# Patient Record
Sex: Female | Born: 1990 | Race: Asian | Hispanic: No | Marital: Single | Smoking: Never smoker
Health system: Southern US, Community
[De-identification: ages and names within clinical notes are randomized; demographics above are authoritative.]

## PROBLEM LIST (undated history)

## (undated) DIAGNOSIS — E785 Hyperlipidemia, unspecified: Secondary | ICD-10-CM

## (undated) DIAGNOSIS — E079 Disorder of thyroid, unspecified: Secondary | ICD-10-CM

## (undated) DIAGNOSIS — E282 Polycystic ovarian syndrome: Secondary | ICD-10-CM

## (undated) DIAGNOSIS — L680 Hirsutism: Secondary | ICD-10-CM

## (undated) HISTORY — DX: Disorder of thyroid, unspecified: E07.9

## (undated) HISTORY — DX: Hyperlipidemia, unspecified: E78.5

## (undated) HISTORY — DX: Polycystic ovarian syndrome: E28.2

## (undated) HISTORY — DX: Hirsutism: L68.0

---
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
## 2011-08-19 ENCOUNTER — Other Ambulatory Visit: Payer: Self-pay | Admitting: Gastroenterology

## 2011-08-19 DIAGNOSIS — R102 Pelvic and perineal pain: Secondary | ICD-10-CM

## 2011-08-20 ENCOUNTER — Ambulatory Visit
Admission: RE | Admit: 2011-08-20 | Discharge: 2011-08-20 | Disposition: A | Discharge status: Home / Self Care | Payer: BC Managed Care – PPO | Source: Ambulatory Visit | Attending: Gastroenterology | Admitting: Gastroenterology

## 2011-08-20 DIAGNOSIS — R102 Pelvic and perineal pain: Secondary | ICD-10-CM

## 2012-07-14 IMAGING — US US PELVIS COMPLETE
1 series · 14 of 25 positions shown · non-contrast
Comparison: No comparison studies available.

CLINICAL DATA: Pelvic pain.  Evaluate for polycystic ovaries.

TRANSABDOMINAL ULTRASOUND OF PELVIS
TECHNIQUE: Transabdominal ultrasound examination of the pelvis was
performed including evaluation of the uterus, ovaries, adnexal
regions, and pelvic cul-de-sac.

[Series 1: us pelvis complete · 0.24mm/px · 14 of 34 slices shown]
[im 1/34]
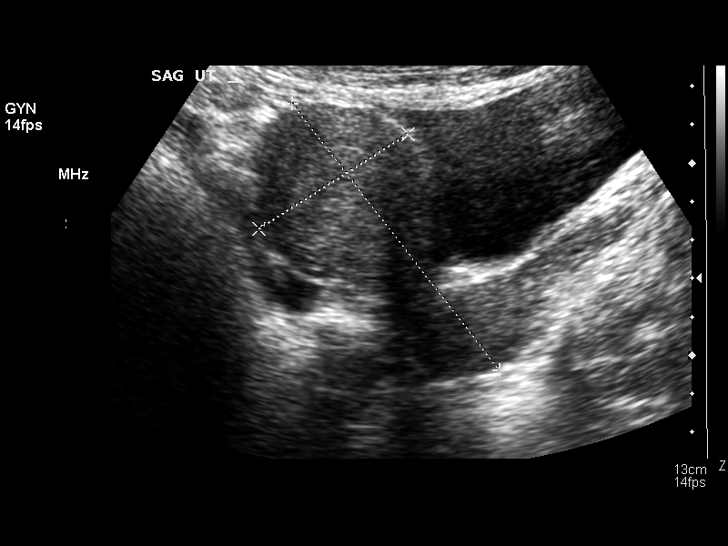
[im 3/34]
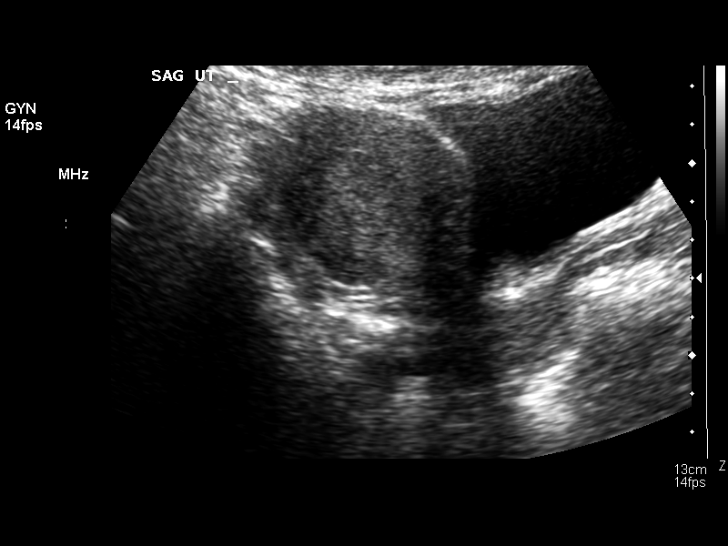
[im 6/34]
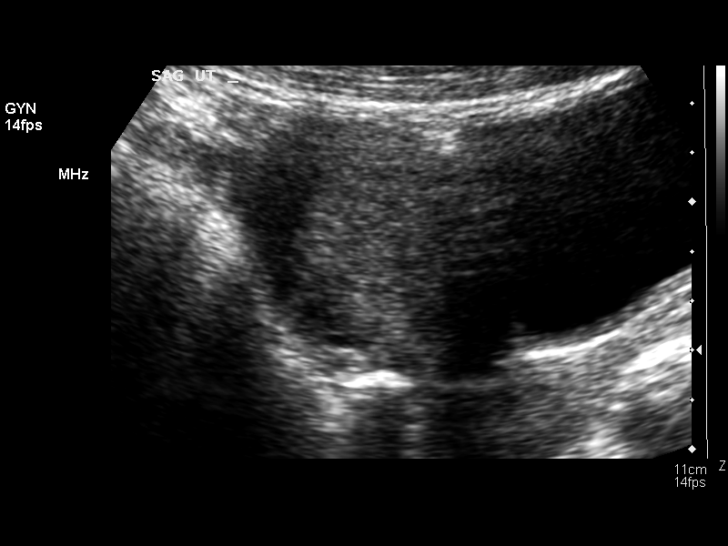
[im 9/34]
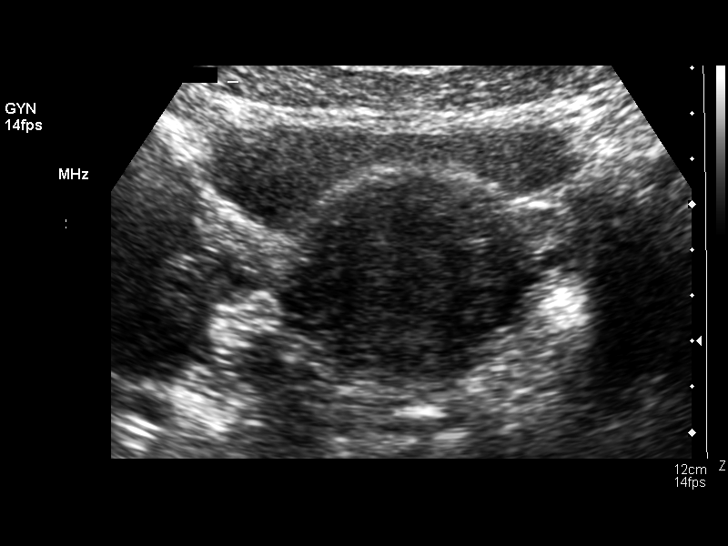
[im 12/34]
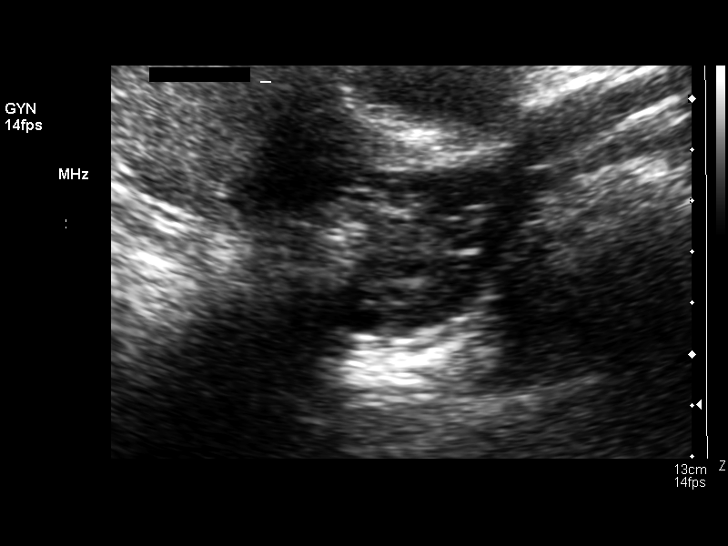
[im 13/34]
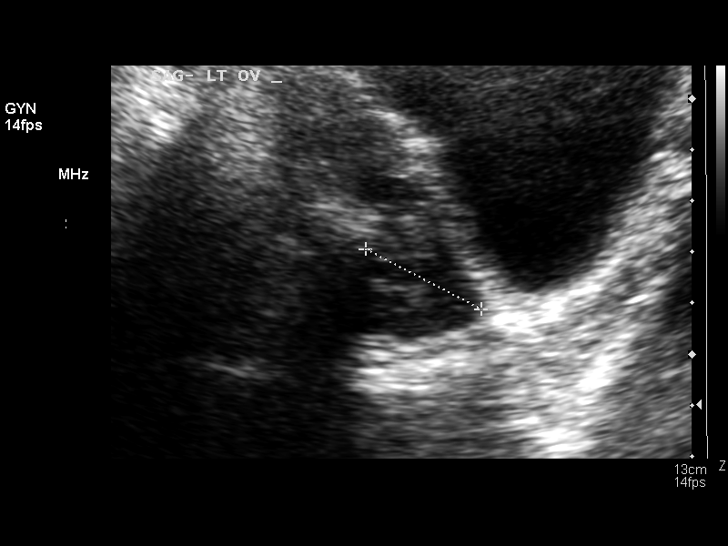
[im 16/34]
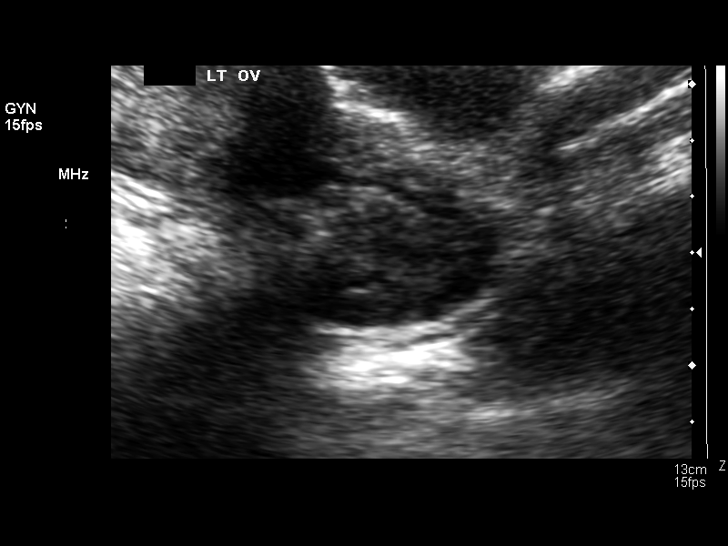
[im 18/34]
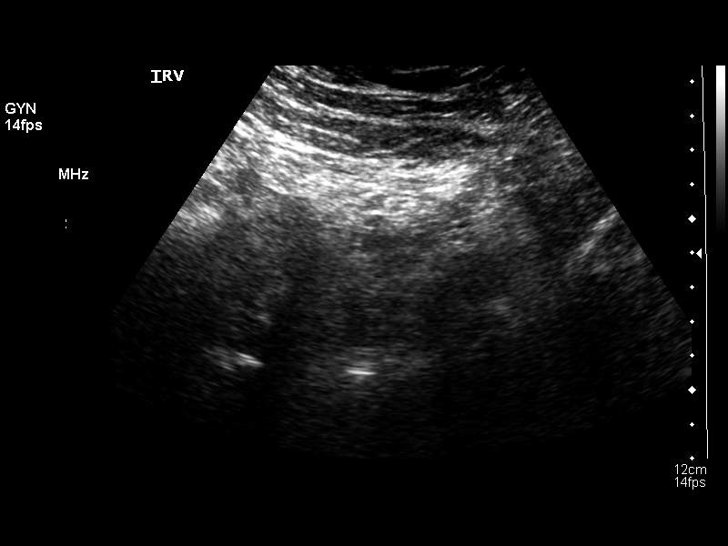
[im 21/34]
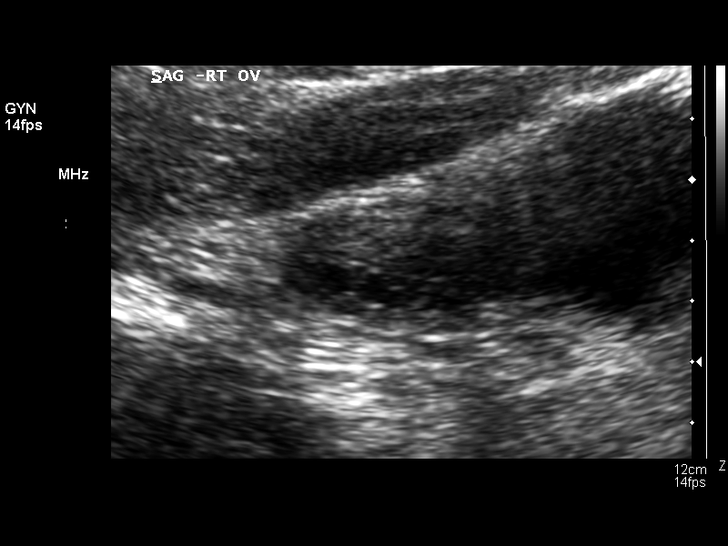
[im 23/34]
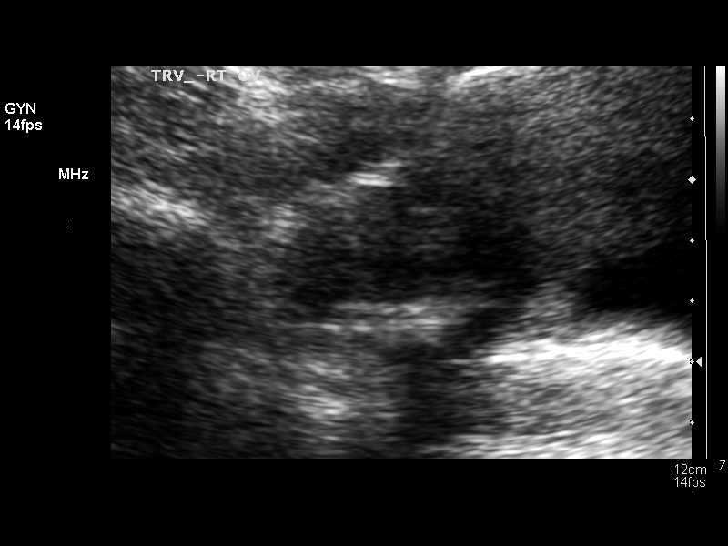
[im 25/34]
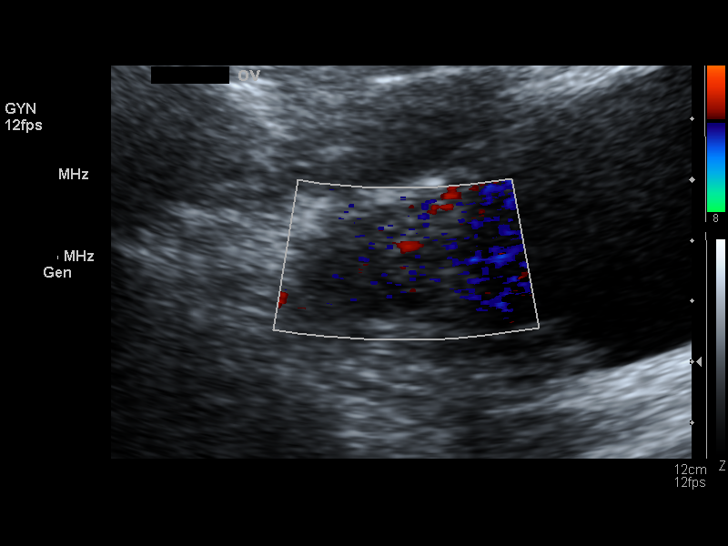
[im 28/34]
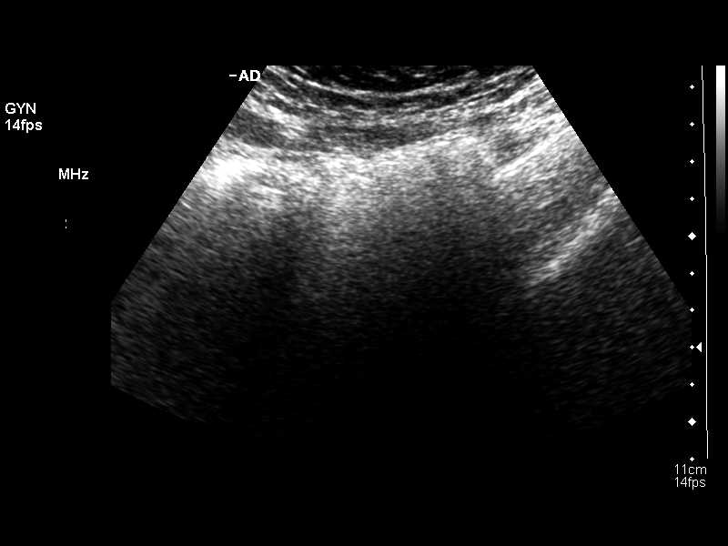
[im 31/34]
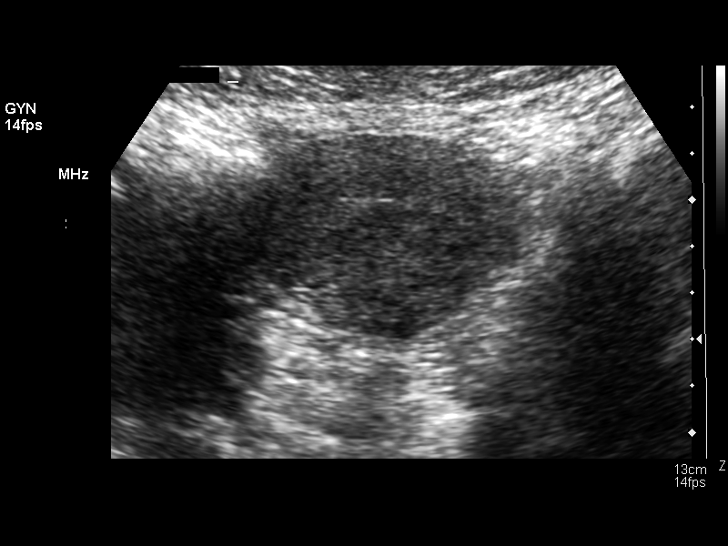
[im 34/34]
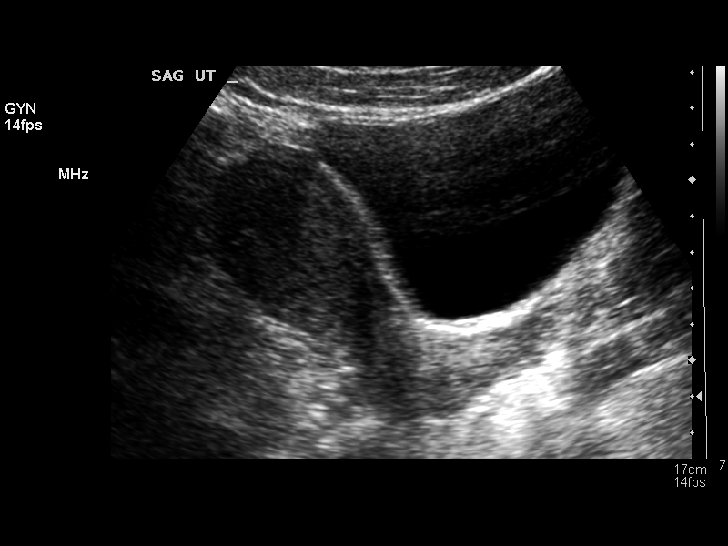

[14 of 25 positions shown; findings below may reference images not displayed]

FINDINGS: Uterus:  8.8 x 4.6 x 5.4 cm.  Myometrial echotexture is
homogeneous.  No evidence for fibroids.

Endometrium: 14-16 mm stripe thickness.  This could be within
normal limits for age. Nofluid in the endometrial cavity.

Right ovary: 3.6 x 2.2 x 3.1 cm.  Sonographically normal.

Left ovary: 3.8 x 2.5 x 2.6 cm.  Sonographically normal.

Other Findings:  No free fluid
IMPRESSION: Normal study. No evidence of pelvic mass or other significant
abnormality.

## 2013-05-09 ENCOUNTER — Encounter: Payer: Self-pay | Admitting: *Deleted

## 2013-05-10 ENCOUNTER — Ambulatory Visit (INDEPENDENT_AMBULATORY_CARE_PROVIDER_SITE_OTHER): Payer: BC Managed Care – PPO | Admitting: Obstetrics & Gynecology

## 2013-05-10 ENCOUNTER — Encounter: Payer: Self-pay | Admitting: Obstetrics & Gynecology

## 2013-05-10 VITALS — BP 118/76 | Ht 67.5 in | Wt 192.2 lb

## 2013-05-10 DIAGNOSIS — Z124 Encounter for screening for malignant neoplasm of cervix: Secondary | ICD-10-CM

## 2013-05-10 DIAGNOSIS — E039 Hypothyroidism, unspecified: Secondary | ICD-10-CM

## 2013-05-10 DIAGNOSIS — Z01419 Encounter for gynecological examination (general) (routine) without abnormal findings: Secondary | ICD-10-CM

## 2013-05-10 DIAGNOSIS — E282 Polycystic ovarian syndrome: Secondary | ICD-10-CM

## 2013-05-10 MED ORDER — LEVOTHYROXINE SODIUM 75 MCG PO TABS: 75.0000 ug | ORAL_TABLET | Freq: Every day | ORAL | Status: DC

## 2013-05-10 MED ORDER — NORETHIN ACE-ETH ESTRAD-FE 1.5-30 MG-MCG PO TABS: 1.0000 | ORAL_TABLET | Freq: Every day | ORAL | Status: DC

## 2013-05-10 MED ORDER — METFORMIN HCL ER (MOD) 500 MG PO TB24: 500.0000 mg | ORAL_TABLET | Freq: Every day | ORAL | Status: DC

## 2013-05-10 NOTE — Progress Notes (Signed)
22 y.o. G0P0000 Single [place] F here for annual exam.  Rising senoir.  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  Studying for this with a prep course.  Stopped spirinolactone for one week.  Doing well with the Loestrin 1.5/30.  Cycle is three or four days.  Regular.  Very light.  Hair growth seems less.  Broke foot while working out with a Psychologist, educational.  Was in a boot for four months.  Now exercising more normally.   Patient's last menstrual period was 04/21/2013.          Sexually active: no  The current method of family planning is OCP (estrogen/progesterone).    Exercising: yes  eliptical at the gym Smoker:  no  Health Maintenance: Pap:  none History of abnormal Pap:  no MMG:  none Colonoscopy:  none BMD:   none TDaP:  Up to date Screening Labs: Dr Loreta Ave, Hb today: Dr Loreta Ave, Urine today: Dr Loreta Ave   reports that she has never smoked. She has never used smokeless tobacco. She reports that  drinks alcohol. She reports that she does not use illicit drugs.  Past Medical History  Diagnosis Date  . PCOS (polycystic ovarian syndrome)   . Thyroid disease   . Hirsutism     History reviewed. No pertinent past surgical history.  Current Outpatient Prescriptions  Medication Sig Dispense Refill  . docusate sodium (COLACE) 100 MG capsule Take 100 mg by mouth 2 (two) times daily.      Marland Kitchen GILDESS FE 1.5/30 1.5-30 MG-MCG tablet daily.      Marland Kitchen levothyroxine (SYNTHROID, LEVOTHROID) 75 MCG tablet Take 75 mcg by mouth daily before breakfast.      . lidocaine-prilocaine (EMLA) cream       . metFORMIN (GLUMETZA) 500 MG (MOD) 24 hr tablet Take 500 mg by mouth daily with breakfast.      . Probiotic Product (PROBIOTIC DAILY PO) Take by mouth daily.      Marland Kitchen spironolactone (ALDACTONE) 50 MG tablet Take 50 mg by mouth 2 (two) times daily.       No current facility-administered medications for this visit.    Family History  Problem Relation Age of Onset  . Thyroid disease Mother   . Breast cancer Paternal Aunt   . Hypertension  Paternal Grandmother   . Hypertension Paternal Grandfather     ROS:  Pertinent items are noted in HPI.  Otherwise, a comprehensive ROS was negative.  Exam:   BP 118/76  Ht 5' 7.5" (1.715 m)  Wt 192 lb 3.2 oz (87.181 kg)  BMI 29.64 kg/m2  LMP 04/21/2013  Weight change: +15lbs  Height: 5' 7.5" (171.5 cm)  Ht Readings from Last 3 Encounters:  05/10/13 5' 7.5" (1.715 m)    General appearance: alert, cooperative and appears stated age Head: Normocephalic, without obvious abnormality, atraumatic Neck: no adenopathy, supple, symmetrical, trachea midline and thyroid normal to inspection and palpation Lungs: clear to auscultation bilaterally Breasts: normal appearance, no masses or tenderness Heart: regular rate and rhythm Abdomen: soft, non-tender; bowel sounds normal; no masses,  no organomegaly Extremities: extremities normal, atraumatic, no cyanosis or edema Skin: Skin color, texture, turgor normal. No rashes or lesions Lymph nodes: Cervical, supraclavicular, and axillary nodes normal. No abnormal inguinal nodes palpated Neurologic: Grossly normal   Pelvic: External genitalia:  no lesions              Urethra:  normal appearing urethra with no masses, tenderness or lesions  Bartholins and Skenes: normal                 Vagina: normal appearing vagina with normal color and discharge, no lesions              Cervix: no lesions              Pap taken: yes Bimanual Exam:  Uterus:  normal size, contour, position, consistency, mobility, non-tender              Adnexa: normal adnexa and no mass, fullness, tenderness               Rectovaginal: Confirms               Anus:  normal sphincter tone, no lesions  A:  Well Woman with normal exam Hypothyroidism PCOS Elevated LDLs Hirsutism  P:  pap smear today. Rx for sythroid, Glumetza (Brand only), and [employer] given for FLP, CMP, Total testosterone, and TSH Pt advised to watch for change in hair growth.  May need to  restart spirinolactone. return annually or prn  An After Visit Summary was printed and given to the patient.

## 2013-05-10 NOTE — Patient Instructions (Addendum)

## 2014-01-16 ENCOUNTER — Encounter: Payer: Self-pay | Admitting: Obstetrics & Gynecology

## 2014-05-14 ENCOUNTER — Other Ambulatory Visit: Payer: Self-pay | Admitting: *Deleted

## 2014-05-14 MED ORDER — NORETHIN ACE-ETH ESTRAD-FE 1.5-30 MG-MCG PO TABS: 1.0000 | ORAL_TABLET | Freq: Every day | ORAL | Status: DC

## 2014-05-14 NOTE — Telephone Encounter (Signed)
Last AEX and refill 05/10/13 #1pack/ 13 refills Next appt 06/17/14  Rx sent for 1 month until appt.

## 2014-05-31 ENCOUNTER — Other Ambulatory Visit: Payer: Self-pay | Admitting: *Deleted

## 2014-05-31 MED ORDER — LEVOTHYROXINE SODIUM 75 MCG PO TABS: 75.0000 ug | ORAL_TABLET | Freq: Every day | ORAL | Status: DC

## 2014-05-31 MED ORDER — METFORMIN HCL ER (MOD) 500 MG PO TB24: 500.0000 mg | ORAL_TABLET | Freq: Every day | ORAL | Status: DC

## 2014-05-31 NOTE — Telephone Encounter (Signed)
Faxed refill request received from [facility] for GLUMETZA Last filled by MD on 05/10/13, #30 X 13 Last AEX - 05/10/13 Next AEX - 06/17/14 RX sent until AEX.

## 2014-05-31 NOTE — Telephone Encounter (Signed)
Faxed refill request received from [facility] for LEVOTHYROXINE Last filled by MD on 05/10/13, #30 X 13 #90 X 4 Last AEX - 05/10/13  Next AEX - 06/17/14  RX sent until AEX.

## 2014-05-31 NOTE — Addendum Note (Signed)
Addended by: Luisa DagoPHILLIPS, STEPHANIE C on: 05/31/2014 09:32 AM   Modules accepted: Orders

## 2014-06-07 ENCOUNTER — Ambulatory Visit: Payer: BC Managed Care – PPO | Admitting: Obstetrics & Gynecology

## 2014-06-17 ENCOUNTER — Encounter: Payer: Self-pay | Admitting: Obstetrics & Gynecology

## 2014-06-17 ENCOUNTER — Ambulatory Visit (INDEPENDENT_AMBULATORY_CARE_PROVIDER_SITE_OTHER): Payer: BC Managed Care – PPO | Admitting: Obstetrics & Gynecology

## 2014-06-17 VITALS — BP 104/70 | HR 64 | Resp 16 | Ht 67.0 in | Wt 192.2 lb

## 2014-06-17 DIAGNOSIS — Z23 Encounter for immunization: Secondary | ICD-10-CM

## 2014-06-17 DIAGNOSIS — Z01419 Encounter for gynecological examination (general) (routine) without abnormal findings: Secondary | ICD-10-CM

## 2014-06-17 DIAGNOSIS — Z202 Contact with and (suspected) exposure to infections with a predominantly sexual mode of transmission: Secondary | ICD-10-CM

## 2014-06-17 MED ORDER — NORETHIN ACE-ETH ESTRAD-FE 1.5-30 MG-MCG PO TABS: 1.0000 | ORAL_TABLET | Freq: Every day | ORAL | Status: DC

## 2014-06-17 NOTE — Progress Notes (Signed)
23 y.o. G0P0000 Single Asian IndianF here for annual exam.  Graduated in may.  Going to [employer] in the fall.  Going to live at [place].    On OCPs.  Cycles regular.    Sexually active.  Has used condoms "most" of the time.  He is [age] and was in the [employer].  [place]  Will be home during the summer.  She will have him meet family then.  Working on weight loss.  Seeing a nutritionist--Bernadette Colllins.  Reports she is down 8 pounds.    Patient's last menstrual period was 06/13/2014.          Sexually active: Yes.    The current method of family planning is OCP (estrogen/progesterone).    Exercising: Yes.    cardio and weights Smoker:  no  Health Maintenance: Pap:  05/10/13 WNL History of abnormal Pap:  no MMG:  none Colonoscopy:  none BMD:   none TDaP:  2004 Screening Labs: Dr Kenna GilbertMann's office, Hb today: Dr Kenna GilbertMann's office, Urine today: declined   reports that she has never smoked. She has never used smokeless tobacco. She reports that she drinks about .5 ounces of alcohol per week. She reports that she does not use illicit drugs.  Past Medical History  Diagnosis Date  . PCOS (polycystic ovarian syndrome)   . Thyroid disease   . Hirsutism     History reviewed. No pertinent past surgical history.  Current Outpatient Prescriptions  Medication Sig Dispense Refill  . b complex vitamins tablet Take 1 tablet by mouth daily.      Marland Kitchen. levothyroxine (SYNTHROID, LEVOTHROID) 75 MCG tablet Take 1 tablet (75 mcg total) by mouth daily before breakfast.  90 tablet  0  . metFORMIN (GLUMETZA) 500 MG (MOD) 24 hr tablet Take 1 tablet (500 mg total) by mouth daily with breakfast.  30 tablet  0  . norethindrone-ethinyl estradiol-iron (GILDESS FE 1.5/30) 1.5-30 MG-MCG tablet Take 1 tablet by mouth daily.  1 Package  0  . docusate sodium (COLACE) 100 MG capsule Take 100 mg by mouth 2 (two) times daily.      Marland Kitchen. lidocaine-prilocaine (EMLA) cream       . Probiotic Product  (PROBIOTIC DAILY PO) Take by mouth daily.       No current facility-administered medications for this visit.    Family History  Problem Relation Age of Onset  . Thyroid disease Mother   . Breast cancer Paternal Aunt   . Hypertension Paternal Grandmother   . Hypertension Paternal Grandfather     ROS:  Pertinent items are noted in HPI.  Otherwise, a comprehensive ROS was negative.  Exam:   BP 104/70  Pulse 64  Resp 16  Ht 5\' 7"  (1.702 m)  Wt 192 lb 3.2 oz (87.181 kg)  BMI 30.10 kg/m2  LMP 06/13/2014  Weight change: stable  Height: 5\' 7"  (170.2 cm)  Ht Readings from Last 3 Encounters:  06/17/14 5\' 7"  (1.702 m)  05/10/13 5' 7.5" (1.715 m)    General appearance: alert, cooperative and appears stated age Head: Normocephalic, without obvious abnormality, atraumatic Neck: no adenopathy, supple, symmetrical, trachea midline and thyroid normal to inspection and palpation Lungs: clear to auscultation bilaterally Breasts: normal appearance, no masses or tenderness Heart: regular rate and rhythm Abdomen: soft, non-tender; bowel sounds normal; no masses,  no organomegaly Extremities: extremities normal, atraumatic, no cyanosis or edema Skin: Skin color, texture, turgor normal. No rashes or lesions Lymph nodes: Cervical, supraclavicular, and  axillary nodes normal. No abnormal inguinal nodes palpated Neurologic: Grossly normal   Pelvic: External genitalia:  no lesions              Urethra:  normal appearing urethra with no masses, tenderness or lesions              Bartholins and Skenes: normal                 Vagina: normal appearing vagina with normal color and discharge, no lesions              Cervix: no lesions              Pap taken: No. Bimanual Exam:  Uterus:  normal size, contour, position, consistency, mobility, non-tender              Adnexa: normal adnexa and no mass, fullness, tenderness               Rectovaginal: Confirms               Anus:  normal sphincter tone,  no lesions  A:  Well Woman with normal exam  Hypothyroidism  PCOS  Elevated LDLs  Hirsutism   P: pap smear today.  Rx for sythroid and Glumetza (Brand only) will be needed.  Awaiting lab results. Labs done at her mother's office.  Lipids, CBC, BMP, Liver, TSH, DHEA, FSH/LH, Total and free testosterone TB testing done as well as her mother's office Tdap today Never finished Gardisil--found on vaccine records.  Will finish series today.   Gildess rx to pharmacy.  #3 months/4RF GC/Chl pending return annually or prn  An After Visit Summary was printed and given to the patient.

## 2014-06-20 LAB — IPS N GONORRHOEA AND CHLAMYDIA BY PCR

## 2014-06-26 ENCOUNTER — Telehealth: Payer: Self-pay | Admitting: Obstetrics & Gynecology

## 2014-06-26 NOTE — Telephone Encounter (Signed)
Patient calling to check on the status of a packet Dr. 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 She says she needs to send it out by Friday, 06/28/14.

## 2014-06-27 NOTE — Telephone Encounter (Signed)
Yes, she has picked it up and copy ready to be scanned in epic. Thank you.//kn

## 2014-06-27 NOTE — Telephone Encounter (Signed)
Completed.  Did pt pick up yet?

## 2014-06-27 NOTE — Telephone Encounter (Signed)
Dr Hyacinth MeekerMiller, have you had time to look at this?

## 2014-07-15 ENCOUNTER — Other Ambulatory Visit: Payer: Self-pay | Admitting: Obstetrics & Gynecology

## 2014-07-15 NOTE — Telephone Encounter (Signed)
Last AEX: 06/17/14 Last refill:05/31/14 #30, [id number] Current AEX:07/08/15  Please advise

## 2014-09-21 ENCOUNTER — Other Ambulatory Visit: Payer: Self-pay | Admitting: Obstetrics & Gynecology

## 2014-09-23 NOTE — Telephone Encounter (Signed)
Last refilled: 05/31/14  #90/0 refills  Last AEX: 06/17/14 with Dr. Charissa BashMiller  AEX Scheduled: 07/08/15 with Dr. Hyacinth MeekerMiller  Please Advise.

## 2014-11-04 ENCOUNTER — Telehealth: Payer: Self-pay | Admitting: Obstetrics & Gynecology

## 2014-11-04 NOTE — Telephone Encounter (Signed)
Return call to patient. She states she is in an appointment and she will call us back shortly.

## 2014-11-04 NOTE — Telephone Encounter (Addendum)
Patient is currently seeing a fitness trainer and would like to get nutrition advice from her trainer instead of her nutritionist. Is this okay with Dr.Miller?

## 2014-11-04 NOTE — Telephone Encounter (Signed)
Pt returning call

## 2014-11-04 NOTE — Telephone Encounter (Signed)
Return call to patient. She sees nutritionist recommended by Dr Hyacinth MeekerMiller monthly for management of PCOS. She has a new Systems analystpersonal trainer than also offers nutritional counseling. Patient wants Dr Rondel BatonMiller's opinion on if she can/should switch nutritional counseling to personal trainer? Advised that I felt Dr Hyacinth MeekerMiller would prefer her to stay with her nutritionist that was approaching her counseling from only nutrient issue instead of exercise/nutrient issue. Advised Dr Hyacinth MeekerMiller will review call and make recommendation and we will call her back.

## 2014-11-09 NOTE — Telephone Encounter (Signed)
I honestly think it is ok to use the trainer for her counseling.  That person will be able to work with her on both diet and exercise.  Can close encounter when pt notified.

## 2014-11-11 NOTE — Telephone Encounter (Signed)
Patient notified. States will try with the trainer for a while, he is actually more strict. If it doesn't work out, she will switch back.  Encounter closed.

## 2015-03-24 ENCOUNTER — Telehealth: Payer: Self-pay | Admitting: Obstetrics & Gynecology

## 2015-03-24 NOTE — Telephone Encounter (Signed)
Message left to return call to Aerik Polan at [phone].    

## 2015-03-24 NOTE — Telephone Encounter (Signed)
Prior authorization to Dr. Hyacinth MeekerMiller to review.

## 2015-03-24 NOTE — Telephone Encounter (Signed)
Spoke with patient. She states she received a letter from [employer] regarding coverage of [employer]umetza.  She states she has coverage for this drug until 04/2015.  Advised our office can request coverage (prior authorization) for Glumetza.

## 2015-03-24 NOTE — Telephone Encounter (Signed)
Patient got a letter from her insurance company about [employer] Sher[employer]Williamslumetza. She has some questions for the nurse.

## 2015-04-04 NOTE — Telephone Encounter (Signed)
[facility].  Patient with hx of intolerance to Metformin (caused nausea and vomiting).  Will await response from [facility].

## 2015-04-08 MED ORDER — GLUMETZA 500 MG PO TB24: 500.0000 mg | ORAL_TABLET | Freq: Every day | ORAL | Status: DC

## 2015-04-08 NOTE — Telephone Encounter (Signed)
Spoke with patient and advised unable to obtain coverage information from [facility] and that she currently has coverage for this drug until 04/20/15. Patient has 5 tablets left and would like to continue. Patient requests that a 90 day supply be ordered at this time so that she can obtain Rx and will have time to obtain prior authorization once coverage is changed. Patient currently has active Rx until 06/2015. Order changed to 90 day supply. Advised patient to return call if any problems with pharmacy. She is agreeable. Routing to provider for final review. Patient agreeable to disposition. Will close encounter

## 2015-04-08 NOTE — Telephone Encounter (Signed)
[facility]. Per fax no prior authorization is required for Glumetza at this time. However, could change in May.   Message left to return call to [place] at [phone]

## 2015-05-21 ENCOUNTER — Telehealth: Payer: Self-pay | Admitting: Obstetrics & Gynecology

## 2015-05-21 DIAGNOSIS — R635 Abnormal weight gain: Secondary | ICD-10-CM

## 2015-05-21 DIAGNOSIS — E282 Polycystic ovarian syndrome: Secondary | ICD-10-CM

## 2015-05-21 NOTE — Telephone Encounter (Signed)
Patient requesting a referral to a nutritionist.

## 2015-05-22 NOTE — Telephone Encounter (Addendum)
Patient calling requesting referral for nutritionist counseling. 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 Her weight has increased from 192 at last visit (2015) to 207 lbs currently. Patient with history of Polycystic ovarian syndrome and on Glumetza.   Advised will send message to Dr Hyacinth MeekerMiller to [facility]. Patient agreeable.   Dr. Hyacinth MeekerMiller, okay to place referral?

## 2015-05-22 NOTE — Telephone Encounter (Signed)
Message left to return call to [place] at [phone].   Patient with next annual exam with Dr. Hyacinth MeekerMiller 07/08/2015.  Hx PCOS.

## 2015-05-23 NOTE — Telephone Encounter (Signed)
Order placed for Nutritionist Counseling.  Cheryl RiegerLaura Reavis (sp?) is skilled with pt's who have PCOS.  Please let pt know she should hear from them next week and I personally spoke with office so if has any issues, please give a call back.  Once pt aware, ok to close encounter.

## 2015-05-23 NOTE — Telephone Encounter (Signed)
Return call to patient. Message given from Dr Hyacinth MeekerMiller. Advised to call back if she has not heard from them by end of next week. Patient agreeable. Encounter closed.

## 2015-06-30 ENCOUNTER — Telehealth: Payer: Self-pay | Admitting: *Deleted

## 2015-06-30 NOTE — Telephone Encounter (Signed)
Patient's mother calling regarding patient's upcoming appointment and what labs she will need. Call back to mother's cell [phone].

## 2015-07-01 NOTE — Telephone Encounter (Signed)
Advised pt's mother of labs to order.  She is a physician and does these in her office.  Encounter closed.

## 2015-07-04 ENCOUNTER — Telehealth: Payer: Self-pay | Admitting: Obstetrics & Gynecology

## 2015-07-04 NOTE — Telephone Encounter (Signed)
Patient has a question about an appointment we scheduled for her.

## 2015-07-04 NOTE — Telephone Encounter (Signed)
Spoke with patient. Patient would like to know if she needs to reschedule aex on 07/08/2015 with Dr.Miller. She has a nutritional consult the same day at 11:15am. Patient's aex is scheduled for 2:45pm. Advised this should be enough time between appointments to keep aex as scheduled. Asking about having her labs for her nutritional appointment. Advised will need to request labs she had performed at mothers office as she is a physician sent to the nutritional consult. Or she may obtain a copy to take with her to that appointment. Patient is agreeable and verbalizes understanding.  Routing to provider for final review. Patient agreeable to disposition. Will close encounter.   Patient aware provider will review message and nurse will return call if any additional advice or change of disposition.

## 2015-07-08 ENCOUNTER — Ambulatory Visit (INDEPENDENT_AMBULATORY_CARE_PROVIDER_SITE_OTHER): Payer: BLUE CROSS/BLUE SHIELD | Admitting: Obstetrics & Gynecology

## 2015-07-08 ENCOUNTER — Encounter: Payer: Self-pay | Admitting: Obstetrics & Gynecology

## 2015-07-08 ENCOUNTER — Encounter: Payer: Self-pay | Admitting: *Deleted

## 2015-07-08 ENCOUNTER — Encounter: Payer: BLUE CROSS/BLUE SHIELD | Attending: Obstetrics & Gynecology | Admitting: *Deleted

## 2015-07-08 VITALS — BP 102/60 | HR 72 | Resp 25 | Ht 67.0 in | Wt 203.0 lb

## 2015-07-08 DIAGNOSIS — E038 Other specified hypothyroidism: Secondary | ICD-10-CM | POA: Diagnosis not present

## 2015-07-08 DIAGNOSIS — Z124 Encounter for screening for malignant neoplasm of cervix: Secondary | ICD-10-CM | POA: Diagnosis not present

## 2015-07-08 DIAGNOSIS — Z01419 Encounter for gynecological examination (general) (routine) without abnormal findings: Secondary | ICD-10-CM

## 2015-07-08 DIAGNOSIS — R635 Abnormal weight gain: Secondary | ICD-10-CM | POA: Diagnosis not present

## 2015-07-08 DIAGNOSIS — E282 Polycystic ovarian syndrome: Secondary | ICD-10-CM

## 2015-07-08 DIAGNOSIS — Z0000 Encounter for general adult medical examination without abnormal findings: Secondary | ICD-10-CM | POA: Diagnosis not present

## 2015-07-08 DIAGNOSIS — Z658 Other specified problems related to psychosocial circumstances: Secondary | ICD-10-CM

## 2015-07-08 DIAGNOSIS — Z713 Dietary counseling and surveillance: Secondary | ICD-10-CM | POA: Insufficient documentation

## 2015-07-08 LAB — POCT URINALYSIS DIPSTICK
Bilirubin, UA: NEGATIVE
GLUCOSE UA: NEGATIVE
NITRITE UA: NEGATIVE
PH UA: 5
PROTEIN UA: NEGATIVE
UROBILINOGEN UA: NEGATIVE

## 2015-07-08 MED ORDER — NORETHIN ACE-ETH ESTRAD-FE 1.5-30 MG-MCG PO TABS: 1.0000 | ORAL_TABLET | Freq: Every day | ORAL | Status: DC

## 2015-07-08 MED ORDER — VITAMIN D (ERGOCALCIFEROL) 1.25 MG (50000 UNIT) PO CAPS: 50000.0000 [IU] | ORAL_CAPSULE | ORAL | Status: AC

## 2015-07-08 MED ORDER — METFORMIN HCL 1000 MG PO TABS: 1000.0000 mg | ORAL_TABLET | Freq: Two times a day (BID) | ORAL | Status: DC

## 2015-07-08 NOTE — Progress Notes (Signed)
Medical Nutrition Therapy:  Appt start time: 1100 end time:  1200.   Assessment:  Primary concerns today: Cheryl Torres is here for nutrition counseling pertaining to PCOS.  She has been diagnosed since 2011 and ever since her weight has been increasing. She was intially seen by Dr. Talmage NapBalan endocrinology and is now being managed by Dr. Hyacinth MeekerMiller, OB/GYN.   Does her own grocery shopping and cooking.  She [place].  She cooks mostly on the stove or bakes.  She eats out 5-8 times/week When at home she eats while watching tv and she thinks she is a fast eater.  She is confused about what to eat because she gets conflicting information.    Preferred Learning Style:   No preference indicated   Learning Readiness:   Ready  MEDICATIONS: see list   DIETARY INTAKE: Usual eating pattern includes 3 meals and 2 snacks per day. Avoided foods include pasta, mushrooms, olives, spicy foods and beef lately.    24-hr recall:  B ( AM): coffee and oatmeal and sometimes Kind bars; scrambled eggs with toast Snk ( AM): not usually  L ( PM): [employer]; chicken salad; Malawiturkey burger; Malawiturkey sandwich with chips Snk ( PM): honey oat bars, sometimes greek yogurt or nuts D ( PM): pizza; chicken, mashed potatoes and biscuit; sushi; steak tacos Snk ( PM): mint tea Beverages: coffee with skim milk or cream, water, tea, alcohol sometimes  Usual physical activity: not much towards the end of school year and early summer, but now is starting to get back into exercise  Estimated energy needs: 2200 calories 248 g carbohydrates 165 g protein 61 g fat    Nutritional Diagnosis:  [place]-2.1 Inpaired nutrition utilization As related to carbohydrates.  As evidenced by PCOS.    Intervention:  Nutrition counseling provided.  Discussed physiology of carbohydrate metabolism and how it is affected by PCOS.  Discussed hormonal imbalances associated with PCOS and how those imbalances  present themselves with hirsutism, body acne, menstrual irregularity, obesity, and poor glycemic control.  Dicussed possible increased risk for CVD and the importance of nutrition management for overall health.   Recommended the Mediterranean style eating plan: MUFAs, whole grains, fruits, vegetables, legumes, lean proteins, and low-fat dairy.  Recommended limiting refined carbohydrates and concentrated sweets in favor of low-glycemic index foods.  Suggested regularly scheduled meals and snacks and to avoid meal skipping.  Recommended fiber and lean protein with all meals and to include non-starchy vegetables with most meals.    Recommended regular physical activity of 150 minutes/week.  Discussed reading food labels: focusing on fiber and limited sugars and saturated, trans fats.  Recommended talking with her provider about increasing her Metformin 2000 mg daily for PCOS.  Recommended taking with food already on her stomach (after she finishes a meal).   Recommended starting at low dose and titrating up slowly to minimize GI distress:  Start with 500 mg for 2 weeks, then 1000 mg for 2 weeks, then 1500 mg for 2 weeks until final dose of 2000 is reached.  Also discussed Ovasitol as insulin sensitizer    Teaching Method Utilized:  Visual Auditory   Handouts given during visit include: Mediterranean lifestyle Reading food labels  Low GI shopping list  15 g carb snacks  What is inositol?  Barriers to learning/adherence to lifestyle change: none  Demonstrated degree of understanding via:  Teach Back   Monitoring/Evaluation:  Dietary intake, exercise, labs, and body weight prn.

## 2015-07-08 NOTE — Progress Notes (Signed)
24 y.o. G0P0000 Single [place] F here for annual exam.  Very tearful today.  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  Is in/out of a relationship with a person who was in the [employer].  Reports she is still following him on social media.  He is dating another person and reports he texts her about every two weeks.  He was diagnosed with "cancer" but won't tell pt what it is exactly.  [place]  Pt states over and over she "doesn't understand why he is doing this to her"--staying in peripheral contact.  States relationship was good when they were together.  Reports she has offered to buy airplane tickets for him.    Pt also reports seeing recent psychic who told her they would get back together in August.  Asks me to read what psychic told her/emailed her.  Advised I do not believe in this and I do not feel I should give advice on this as I do not ptu in value in psychic readings.    Pt states she and parents are really at each other right now.  Reports she is very hurt at being called a "fornicator" and "adulterer" by mother.  [place] but doesn't know how she can move into an appt without furniture.  Parents bought her appt here that is fully furnished.  We discussed how most individuals start their adult life, which is with very few material items and then accumulate with age and time.  Also reports she has a luxury car and repair/upkeep is expensive.  Has asked parents to buy her a Toyota--"something cheap and easy to maintain".       Pt is seeing a therapist.  Has gained weight but reports seeing nutritionist today.  Not exercising although has seen trainer in the past.  Was advised to go ahead and increased her Metformin dosage.  Pt on XL and went to get filled and was $1400.  Didn't get it.  Advised could always switch back to regular Metformin.  Will see if this is a prior authorization issue.  Cycles are regular on OCPs.    Reports two sexual partners within last  year.  Has not has STD testing.  Patient's last menstrual period was 06/12/2015.          Sexually active: Yes.    The current method of family planning is OCP (estrogen/progesterone).    Exercising: Yes.    cardio and weights Smoker:  no  Health Maintenance: Pap:  05/10/13 WNL History of abnormal Pap:  no MMG:  none Colonoscopy:  none BMD:   none TDaP:  2015 Screening Labs: Dr Loreta AveMann, Hb today: Dr Loreta AveMann, Urine today: WBC-1+, KETONES-trace, RBC-trace   reports that she has never smoked. She has never used smokeless tobacco. She reports that she drinks about 1.2 - 1.8 oz of alcohol per week. She reports that she does not use illicit drugs.  Past Medical History  Diagnosis Date  . PCOS (polycystic ovarian syndrome)   . Thyroid disease   . Hirsutism   . Hyperlipidemia     No past surgical history on file.  Current Outpatient Prescriptions  Medication Sig Dispense Refill  . GLUMETZA 500 MG 24 hr tablet Take 1 tablet (500 mg total) by mouth daily with breakfast. 90 tablet 0  . levothyroxine (SYNTHROID, LEVOTHROID) 75 MCG tablet take 1 tablet by mouth every morning ON AN EMPTY STOMACH 90 tablet 3  .  norethindrone-ethinyl estradiol-iron (GILDESS FE 1.5/30) 1.5-30 MG-MCG tablet Take 1 tablet by mouth daily. 3 Package 4  . Probiotic Product (PROBIOTIC DAILY PO) Take by mouth daily.    . Vitamin D, Ergocalciferol, (DRISDOL) 50000 UNITS CAPS capsule Take 50,000 Units by mouth every 7 (seven) days.     No current facility-administered medications for this visit.    Family History  Problem Relation Age of Onset  . Thyroid disease Mother   . Breast cancer Paternal Aunt   . Hypertension Paternal Grandmother   . Hypertension Paternal Grandfather   . Heart attack Paternal Grandfather   . Hyperlipidemia Father   . Hyperlipidemia Other     ROS:  Pertinent items are noted in HPI.  Otherwise, a comprehensive ROS was negative.  Exam:   LMP 06/12/2015  Weight change: +11#       Ht  Readings from Last 3 Encounters:  06/17/14  (1.702 m)  05/10/13 5' 7.5" (1.715 m)    General appearance: alert, cooperative and appears stated age Head: Normocephalic, without obvious abnormality, atraumatic Neck: no adenopathy, supple, symmetrical, trachea midline and thyroid normal to inspection and palpation Lungs: clear to auscultation bilaterally Breasts: normal appearance, no masses or tenderness Heart: regular rate and rhythm Abdomen: soft, non-tender; bowel sounds normal; no masses,  no organomegaly Extremities: extremities normal, atraumatic, no cyanosis or edema Skin: Skin color, texture, turgor normal. No rashes or lesions Lymph nodes: Cervical, supraclavicular, and axillary nodes normal. No abnormal inguinal nodes palpated Neurologic: Grossly normal   Pelvic: External genitalia:  no lesions              Urethra:  normal appearing urethra with no masses, tenderness or lesions              Bartholins and Skenes: normal                 Vagina: normal appearing vagina with normal color and discharge, no lesions              Cervix: no lesions              Pap taken: Yes.   Bimanual Exam:  Uterus:  normal size, contour, position, consistency, mobility, non-tender              Adnexa: normal adnexa and no mass, fullness, tenderness               Rectovaginal: Confirms               Anus:  normal sphincter tone, no lesions  Chaperone was present for exam.  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  Information provided regarding this. Contnue OCPs.  Gildess FE rx to pharmacy for 1 year. Rx for  levothyroxine daily to pharmacy. Continue Vit D 50K weekly.  Rx to pharmacy Highly encouraged to continue with therapist.  Concerned about choices she is making regarding relationships return annually or prn  Very lengthy visit.  In excess of an hour spent with pt, >30 minutes spent discussing pt's relationship issues, issues with parents, and decision not to return to school.

## 2015-07-10 ENCOUNTER — Encounter: Payer: Self-pay | Admitting: Obstetrics & Gynecology

## 2015-07-10 MED ORDER — LEVOTHYROXINE SODIUM 75 MCG PO TABS: ORAL_TABLET | ORAL | Status: AC

## 2015-07-14 ENCOUNTER — Telehealth: Payer: Self-pay | Admitting: Obstetrics & Gynecology

## 2015-07-14 LAB — IPS PAP TEST WITH REFLEX TO HPV

## 2015-07-14 MED ORDER — METFORMIN HCL 500 MG PO TABS: 500.0000 mg | ORAL_TABLET | Freq: Two times a day (BID) | ORAL | Status: DC

## 2015-07-14 MED ORDER — METFORMIN HCL 500 MG PO TABS: 500.0000 mg | ORAL_TABLET | Freq: Every day | ORAL | Status: DC

## 2015-07-14 NOTE — Telephone Encounter (Signed)
Patient has questions on dosage for metformin Dr Hyacinth Meeker prescribed. States its different than what she was previously taking and is currently taking  dose.

## 2015-07-14 NOTE — Telephone Encounter (Signed)
Left message to call Kaitlyn at [phone]. 

## 2015-07-14 NOTE — Telephone Encounter (Signed)
Rx for Metformin  daily to pharmacy.  As pt has been on XL Glumetza, ok to start taking  bid for two weeks.  After that time, can increase to  in am and  in pm.  After doing this for another two weeks, increase to  BID.  If has increased nausea or diarrhea, can back down the dosing.  Goal is  twice daily.  Please call with any questions and when needs RF.  It is OK if she can't take the  BID due to side effects.  Just increase to the place where she is tolerating the medication without side effects.

## 2015-07-14 NOTE — Telephone Encounter (Signed)
Spoke with patient. Patient states that she picked up new rx Dr.Miller sent in for her. Metformin 1,000 mg. States she was advised to work up to taking 2,000 mg daily. Patient is unable to cut the 1,000 mg pills in half to start with 500 mg daily. Patient is asking if new rx for 500 mg daily can be sent in or if she should go ahead and start taking to 1,000 mg tablets daily. Advised will need to speak with Dr.Miller regarding rx recommendations and return call. Patient is agreeable.

## 2015-07-15 NOTE — Telephone Encounter (Signed)
Spoke with patient. Advised of message as seen below from Dr.Miller. Patient is agreeable and verbalizes understanding. Will call with any further questions and when needs RF.  Routing to provider for final review. Patient agreeable to disposition. Will close encounter.

## 2015-07-18 ENCOUNTER — Telehealth: Payer: Self-pay | Admitting: Obstetrics & Gynecology

## 2015-07-18 NOTE — Telephone Encounter (Signed)
Patient in office on 19th. Dr Hyacinth Meeker increased metformin dose and patient reports diarrhea and stomach cramping since beginning to increase medication. Original doseage  increasing gradually to  - currently at  per day.

## 2015-07-18 NOTE — Telephone Encounter (Addendum)
Message left to return call to [place] at [phone].   Patient previously on [place], now requires prior authorization from [employer].   Prior authorization requirements for Glumetza from [facility] is to try and fail Glucophage XR or Metformin SR.   Routing to Dr. Hyacinth Meeker to review and advise if can change order to Glucophage XR or Metformin SR.

## 2015-07-18 NOTE — Telephone Encounter (Signed)
Patient returned call. She has been experiencing stomach cramps and diarrhea. Attributes this to Metformin, as she has not taken Metformin today and denies symptoms.   Patient wants to restart Glumetza. Advised that according to her insurance, requires her to try alternatives prior to covering Glumetza.  Advised would send message to Dr. Hyacinth Meeker to see if alternatives could be ordered and would return her call with instructions. Alternatives are Glucophage XR or Metformin SR.  Patient agreeable.

## 2015-07-21 MED ORDER — METFORMIN HCL ER 500 MG PO TB24: 500.0000 mg | ORAL_TABLET | Freq: Every day | ORAL | Status: DC

## 2015-07-21 MED ORDER — METFORMIN HCL ER (MOD) 1000 MG PO TB24: 1000.0000 mg | ORAL_TABLET | Freq: Every day | ORAL | Status: DC

## 2015-07-21 NOTE — Telephone Encounter (Signed)
Rx sent for Glucophage XR  daily.  If ok with this and no side effects, will then increase to Glucophage XL  daily.  Please let pt know rx at pharmacy but why we cannot do Glumetza rx yet.

## 2015-07-22 NOTE — Telephone Encounter (Signed)
Called patient and gave message from Dr. Hyacinth Meeker. Advised patient to DC Metformin and begin taking Glucophage XR 500 mg daily. Advised to call back with update. Advised to call if develops stomach cramps or diarrhea. Patient agreeable and verbalized understanding to all instructions and plan of care.   Routing to provider for final review. Patient agreeable to disposition. Will close encounter.

## 2015-08-12 ENCOUNTER — Telehealth: Payer: Self-pay | Admitting: Obstetrics & Gynecology

## 2015-08-12 MED ORDER — METFORMIN HCL ER 750 MG PO TB24: 750.0000 mg | ORAL_TABLET | Freq: Every day | ORAL | Status: DC

## 2015-08-12 NOTE — Telephone Encounter (Signed)
Returned call to [employer] [phone]

## 2015-08-12 NOTE — Telephone Encounter (Signed)
Spoke with patient. Advised of message as seen below from Dr.Miller. Patient is agreeable and verbalizes understanding. Will start taking Vitamin D 50,000 weekly. Will start taking increased dose of Metformin 750 mg daily. Will notify our office if she develops any new side effects.  Routing to provider for final review. Patient agreeable to disposition. Will close encounter.

## 2015-08-12 NOTE — Telephone Encounter (Signed)
Patient is moving to [place] this weekend and is asking if she needs to make any changes to her Metformin prescription. Last seen 07/08/15.

## 2015-08-12 NOTE — Telephone Encounter (Signed)
Vit D has nothing to do with the PCOS.  Low Vit D can cause fatigue and bone issues like osteoporosis later in life.  Doesn't have to take it if doesn't want to take it.  Rx to Metformin  XL done to pharmacy on file.  Please inform her and she should let us know if having any side effects.  Thanks.

## 2015-08-12 NOTE — Telephone Encounter (Signed)
Left message to call Kirbie Stodghill at [phone]. 

## 2015-08-12 NOTE — Telephone Encounter (Signed)
Spoke with patient. Patient states that she has been taking Metformin XR 500 mg tablets daily with no problems. Patient is going to be moving this weekend to [facility] and is asking if Dr.Miller would like to increase her dosage before she moves. Per last note from Dr.Miller on 7/29 if patient was doing well with this dosage would increase to 750 mg daily. Patient is agreeable and is requesting new rx to pharmacy on file. Also asking why she needs to continue taking her Vitamin D rx. "I do not know if it has to do with everything else or why I should take it. I am not sure when to take it." Advised patient per rx directions will need to take one Vitamin D 50,000 weekly. May choose a day during the week that works well for her and take on the same day every week. Patient would like clarification of why she needs to proceed with this rx. Advised I will speak with Dr.Miller and return call. Patient is agreeable.  Dr.Miller, okay to increase Metformin XR to 750 mg daily? Patient also asking for clarification about why taking Vitamind D is important for her condition. Please advise.

## 2015-09-15 ENCOUNTER — Telehealth: Payer: Self-pay | Admitting: Obstetrics & Gynecology

## 2015-09-15 NOTE — Telephone Encounter (Signed)
Spoke with patient. Patient has been taking Metformin XR 500 mg daily. Stopped last week on 09/10/2015 due to increased stomach cramping. Denies any diarrhea, nausea, or vomiting. Taking rx for PCOS. "I feel that my stomach problems may be stress related, but I am not sure. My mom told me to stop taking it last week." Patient's mom is GI physician. Patient has been on Glumetza before without any problems, but insurance stopped covering the medication. Had to try and fail Metformin SR or Glucophage XR. Patient is asking what she should do from here. "I do not want my PCOS to get worse, but do not know what to do." Patient has recently moved to [facility]. Advised will speak with covering provider Dr.Silva and return call with recommendations. Patient is agreeable.

## 2015-09-15 NOTE — Telephone Encounter (Signed)
Please send Rx for Glumetza 24 hour tablet, 500 mg, to pharmacy of choice.  Dispense 30, RF 9. I recommend patient follow the dietary guidelines given at the nutrition consultation.  Of note, I did review her recent labs from this summer.and her lipid panel showed increased cholesterol.  I would recommend trying to lower cholesterol in her diet and limiting saturated fats.  Her blood sugar looked like it was in a good range.

## 2015-09-15 NOTE — Telephone Encounter (Signed)
Patient is having stomach pains and thinks this is due to metformin. Patient stopped taking metformin and has some questions regarding taking a natural metformin. Last seen 07/08/15.

## 2015-09-15 NOTE — Telephone Encounter (Signed)
I will be happy to prescribe Glumetza 24 hour tablet for her.  I see different dosages in the computer for her prescriptions, both 500 mg and 1000 mg.  Does the patient remember the dosage she was taking?

## 2015-09-15 NOTE — Telephone Encounter (Signed)
Spoke with patient. Patient states that she was taking Glumetza 500 mg daily. Would like to send this in to [facility] off [address] in [place].   Dr.Silva, okay to send in at this time? Is also asking if there are specific things she should reduce in her diet with PCOS.

## 2015-09-16 MED ORDER — METFORMIN HCL ER (MOD) 500 MG PO TB24: 500.0000 mg | ORAL_TABLET | Freq: Every day | ORAL | Status: DC

## 2015-09-16 NOTE — Telephone Encounter (Signed)
Spoke with patient. Advised of message as seen below from Dr.Silva. Patient is agreeable and verbalizes understanding. Rx for Glumetza 24 hour tablet 500 mg #30 9RF sent to [facility] in [place] per patient request. Advised may need PA through her insurance. If so I will complete this and notify her of approval or denial which can take 24-72 hours after form is completed. Patient is agreeable.  Routing to provider for final review. Patient agreeable to disposition. Will close encounter.

## 2015-10-01 ENCOUNTER — Telehealth: Payer: Self-pay | Admitting: Obstetrics & Gynecology

## 2015-10-01 NOTE — Telephone Encounter (Signed)
Spoke with patient. Glumetza was called in for her to the pharmacy on 09/16/15 and she was advised by the nurse then.  She states that she was not aware of this. I called [facility] in [place] and confirmed they had prescription in for patient, they confirmed they do but they do not have the patient's insurance information so they have not attempted to fill the prescription.   Advised patient to call [facility] and give them her insurance information.  Advised I have sent a prior authorization to [facility] [employer] [facility] to request that they cover Glumetza because patient has tried and failed Metformin 500, 1000 and Metformin XR.  Advised patient that typically takes 3-5 business days to hear response for prior authorizations and if she has any problems with obtaining prescription to please let us know.  Patient agreeable.  Routing to Dr. Hyacinth MeekerMiller, will close. Prior authorization pending.

## 2015-10-01 NOTE — Telephone Encounter (Signed)
Patient is having some problems after stopping metformin. Patient said Dr.Miller was going to change her to another medication. Patient called last week and has not received a call back. Patient is aware Dr.Miller is out of the office, but wold like to talk to Dr.Miller personally when she returns to the office. Patient is aware a Triage nurse will need to call her first. Last seen 07/08/15.

## 2015-10-03 NOTE — Telephone Encounter (Signed)
Prior authorization was cancelled by [facility] due to prior denial in July. It is now in courtesy review and will be given response in 3-5 business days.

## 2015-10-07 NOTE — Telephone Encounter (Signed)
Called [employer] for update. Spoke with representative Simone T. She states that request is still under provider courtesy review and will be finalized on 10/08/15.

## 2015-10-09 NOTE — Telephone Encounter (Signed)
Dr. Hyacinth MeekerMiller,  [facility] denied prior authorization request for [employer]. Patient has tried and failed Metformin 500, 1000 and Metformin XR. The denial also went to courtesy review.

## 2015-10-11 NOTE — Telephone Encounter (Signed)
Denial letter states denial was because she does not have diagnosis of diabetes.  Please inform her of this.  She can make a personal appeal if desired.  I can bring you letter to share information with her.   I think she should be on one of the ones she's tried, even a lower dose, if she was without symptoms.  Has any dosage worked for her?  Thanks.

## 2015-10-13 ENCOUNTER — Telehealth: Payer: Self-pay | Admitting: Obstetrics & Gynecology

## 2015-10-13 NOTE — Telephone Encounter (Signed)
Spoke with patient. Discussed message from Dr. Hyacinth MeekerMiller and denial information from [facility] [employer]. Patient states she is getting conflicting information from her Mother and our office and nutritionist that Dr. Hyacinth MeekerMiller referred her to.   Patient taking Metformin (GLUCOPHAGE-XR) 750 MG 24 hr tablet  today and Friday without side effects.    Spoke with Dr. Hyacinth MeekerMiller and message from Dr. Hyacinth MeekerMiller given to patient. Patient to take dosage that higher without side effects. Advised if taking the 750 mg xr that this is once a day only and should not be cut. Patient verbalized understanding. She will continue to take 750 mg XR dose and return call if any concerns.   Discontinued Glumetza.  Routing to provider for final review. Patient agreeable to disposition. Will close encounter.

## 2015-10-13 NOTE — Telephone Encounter (Signed)
Message left to return call to Skylar Priest at [phone].    

## 2015-10-13 NOTE — Telephone Encounter (Signed)
Patient called and said, "I am calling to follow up on Metformin. I have called two times about this but it has not been resolved. I'd really like to speak with Dr. Hyacinth MeekerMiller directly to get a straight answer since I am also getting conflicting information from my mom who is a doctor too." See previous telephone notes.

## 2015-10-13 NOTE — Telephone Encounter (Signed)
Jerene BearsMary S Miller, MD at 10/11/2015 7:38 AM    Status: Signed         Denial letter states denial was because she does not have diagnosis of diabetes. Please inform her of this. She can make a personal appeal if desired. I can bring you letter to share information with her.   I think she should be on one of the ones she's tried, even a lower dose, if she was without symptoms. Has any dosage worked for her? Thanks.

## 2015-10-13 NOTE — Telephone Encounter (Signed)
Patient called today. See telephone encounter 10/13/15.

## 2016-07-14 ENCOUNTER — Telehealth: Payer: Self-pay | Admitting: Obstetrics & Gynecology

## 2016-07-14 NOTE — Telephone Encounter (Signed)
Patient has relocated to [place], [place] and cancelled her AEX for 07/19/16 with Dr. Hyacinth Meeker. Okay to remove recall?

## 2016-07-14 NOTE — Telephone Encounter (Signed)
Yes.  Ok to remove from recall.

## 2016-07-14 NOTE — Telephone Encounter (Signed)
Recall removed. Please close encounter unless further follow up is needed.

## 2016-07-19 ENCOUNTER — Ambulatory Visit: Payer: BLUE CROSS/BLUE SHIELD | Admitting: Obstetrics & Gynecology

## 2016-08-13 ENCOUNTER — Other Ambulatory Visit: Payer: Self-pay | Admitting: Obstetrics & Gynecology

## 2016-08-13 NOTE — Telephone Encounter (Signed)
Medication refill request: Lar[place] Last AEX:  07/08/15 SM Next AEX: Not scheduled  Last MMG (if hormonal medication request): n/a Refill authorized: 07/08/15 #3 Packages 4R. Please advise. Thank you.

## 2016-08-13 NOTE — Telephone Encounter (Signed)
Please call pt.  RF was done for 3 months.  No RFs given.  [place] needs to [place] care in [place] or be seen for additional RFs.

## 2016-08-16 NOTE — Telephone Encounter (Signed)
Left detailed voice message (per DPR) and advised about medication approval and that for further refills, she will need to make an appointment here or establish care in [place].
